# Patient Record
Sex: Male | Born: 1980 | Hispanic: Yes | Marital: Single | Smoking: Never smoker
Health system: Southern US, Community
[De-identification: ages and names within clinical notes are randomized; demographics above are authoritative.]

---

## 2020-04-26 ENCOUNTER — Encounter: Payer: Self-pay | Admitting: Emergency Medicine

## 2020-04-26 ENCOUNTER — Other Ambulatory Visit: Payer: Self-pay

## 2020-04-26 ENCOUNTER — Emergency Department: Payer: BC Managed Care – PPO

## 2020-04-26 ENCOUNTER — Emergency Department
Admission: EM | Admit: 2020-04-26 | Discharge: 2020-04-26 | Disposition: A | Discharge status: Home / Self Care | Payer: BC Managed Care – PPO | Attending: Emergency Medicine | Admitting: Emergency Medicine

## 2020-04-26 DIAGNOSIS — R0789 Other chest pain: Secondary | ICD-10-CM

## 2020-04-26 LAB — BASIC METABOLIC PANEL
Anion gap: 10 (ref 5–15)
BUN: 22 mg/dL — ABNORMAL HIGH (ref 6–20)
CO2: 26 mmol/L (ref 22–32)
Calcium: 9.5 mg/dL (ref 8.9–10.3)
Chloride: 102 mmol/L (ref 98–111)
Creatinine, Ser: 1.14 mg/dL (ref 0.61–1.24)
GFR calc Af Amer: >60 mL/min (ref >60)
GFR calc non Af Amer: >60 mL/min (ref >60)
Glucose, Bld: 153 mg/dL — ABNORMAL HIGH (ref 70–99)
Potassium: 3.9 mmol/L (ref 3.5–5.1)
Sodium: 138 mmol/L (ref 135–145)

## 2020-04-26 LAB — CBC
HCT: 42.6 % (ref 39.0–52.0)
Hemoglobin: 15 g/dL (ref 13.0–17.0)
MCH: 33.2 pg (ref 26.0–34.0)
MCHC: 35.2 g/dL (ref 30.0–36.0)
MCV: 94.2 fL (ref 80.0–100.0)
Platelets: 253 10*3/uL (ref 150–400)
RBC: 4.52 MIL/uL (ref 4.22–5.81)
RDW: 11.9 % (ref 11.5–15.5)
WBC: 20.1 10*3/uL — ABNORMAL HIGH (ref 4.0–10.5)
nRBC: 0 % (ref 0.0–0.2)

## 2020-04-26 LAB — TROPONIN I (HIGH SENSITIVITY)
Troponin I (High Sensitivity): 8 ng/L (ref <18)
Troponin I (High Sensitivity): 9 ng/L (ref <18)

## 2020-04-26 MED ORDER — IBUPROFEN 800 MG PO TABS: 800.0000 mg | ORAL_TABLET | Freq: Three times a day (TID) | ORAL | 0 refills | Status: AC | PRN

## 2020-04-26 MED ORDER — KETOROLAC TROMETHAMINE 30 MG/ML IJ SOLN
30.0000 mg | Freq: Once | INTRAMUSCULAR | Status: AC
  Administered 2020-04-26: 30 mg via INTRAVENOUS
  Filled 2020-04-26: qty 1

## 2020-04-26 MED ORDER — IOHEXOL 300 MG/ML  SOLN
75.0000 mL | Freq: Once | INTRAMUSCULAR | Status: AC | PRN
  Administered 2020-04-26: 75 mL via INTRAVENOUS
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
## 2020-04-26 NOTE — ED Notes (Signed)
Pt to ct scan.

## 2021-05-30 IMAGING — CR DG CHEST 2V
1 series · 2 of 2 positions shown · non-contrast
Comparison: None.

CLINICAL DATA: Chest pain

EXAM:
CHEST - 2 VIEW

[Series 1: w chest pa · 0.14mm/px · 2 of 2 slices shown]
[im 1/2]
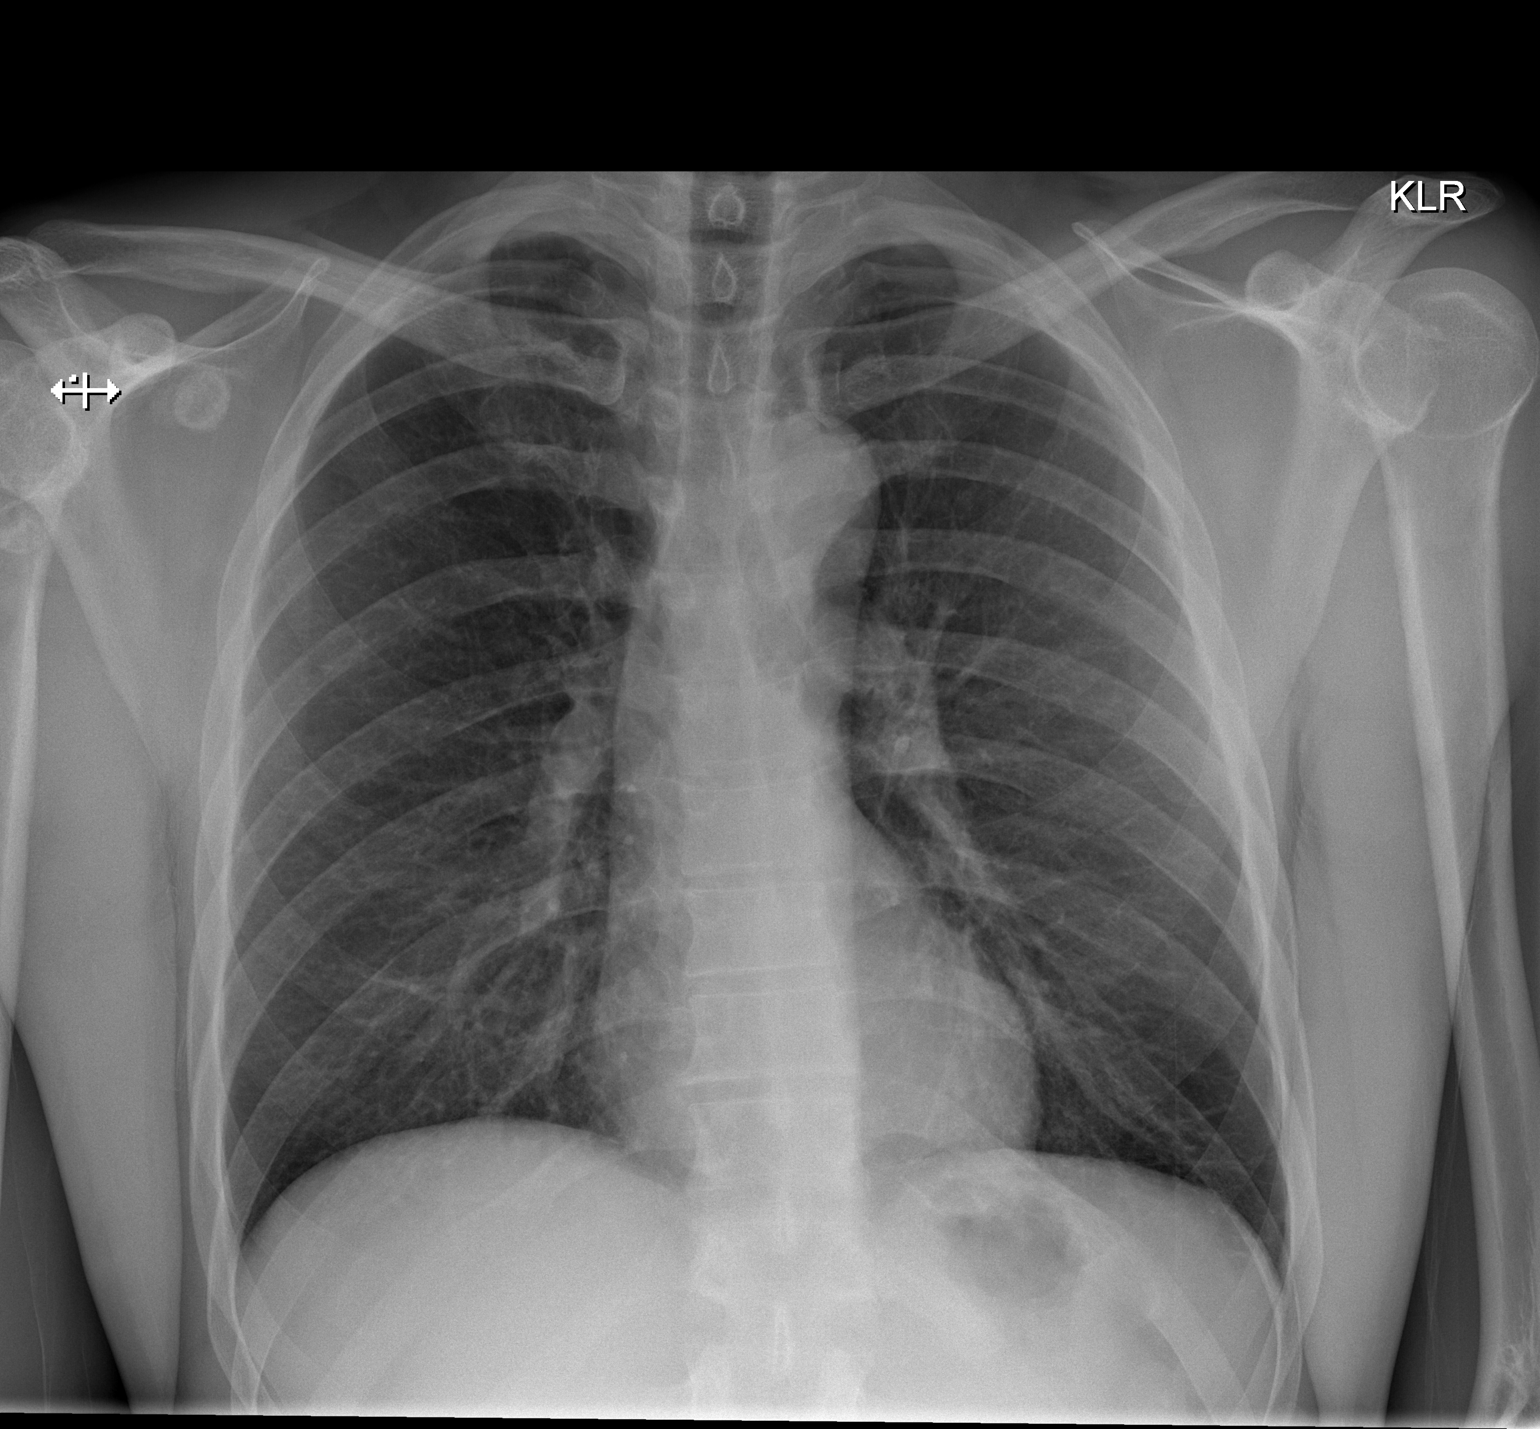
[im 2/2]
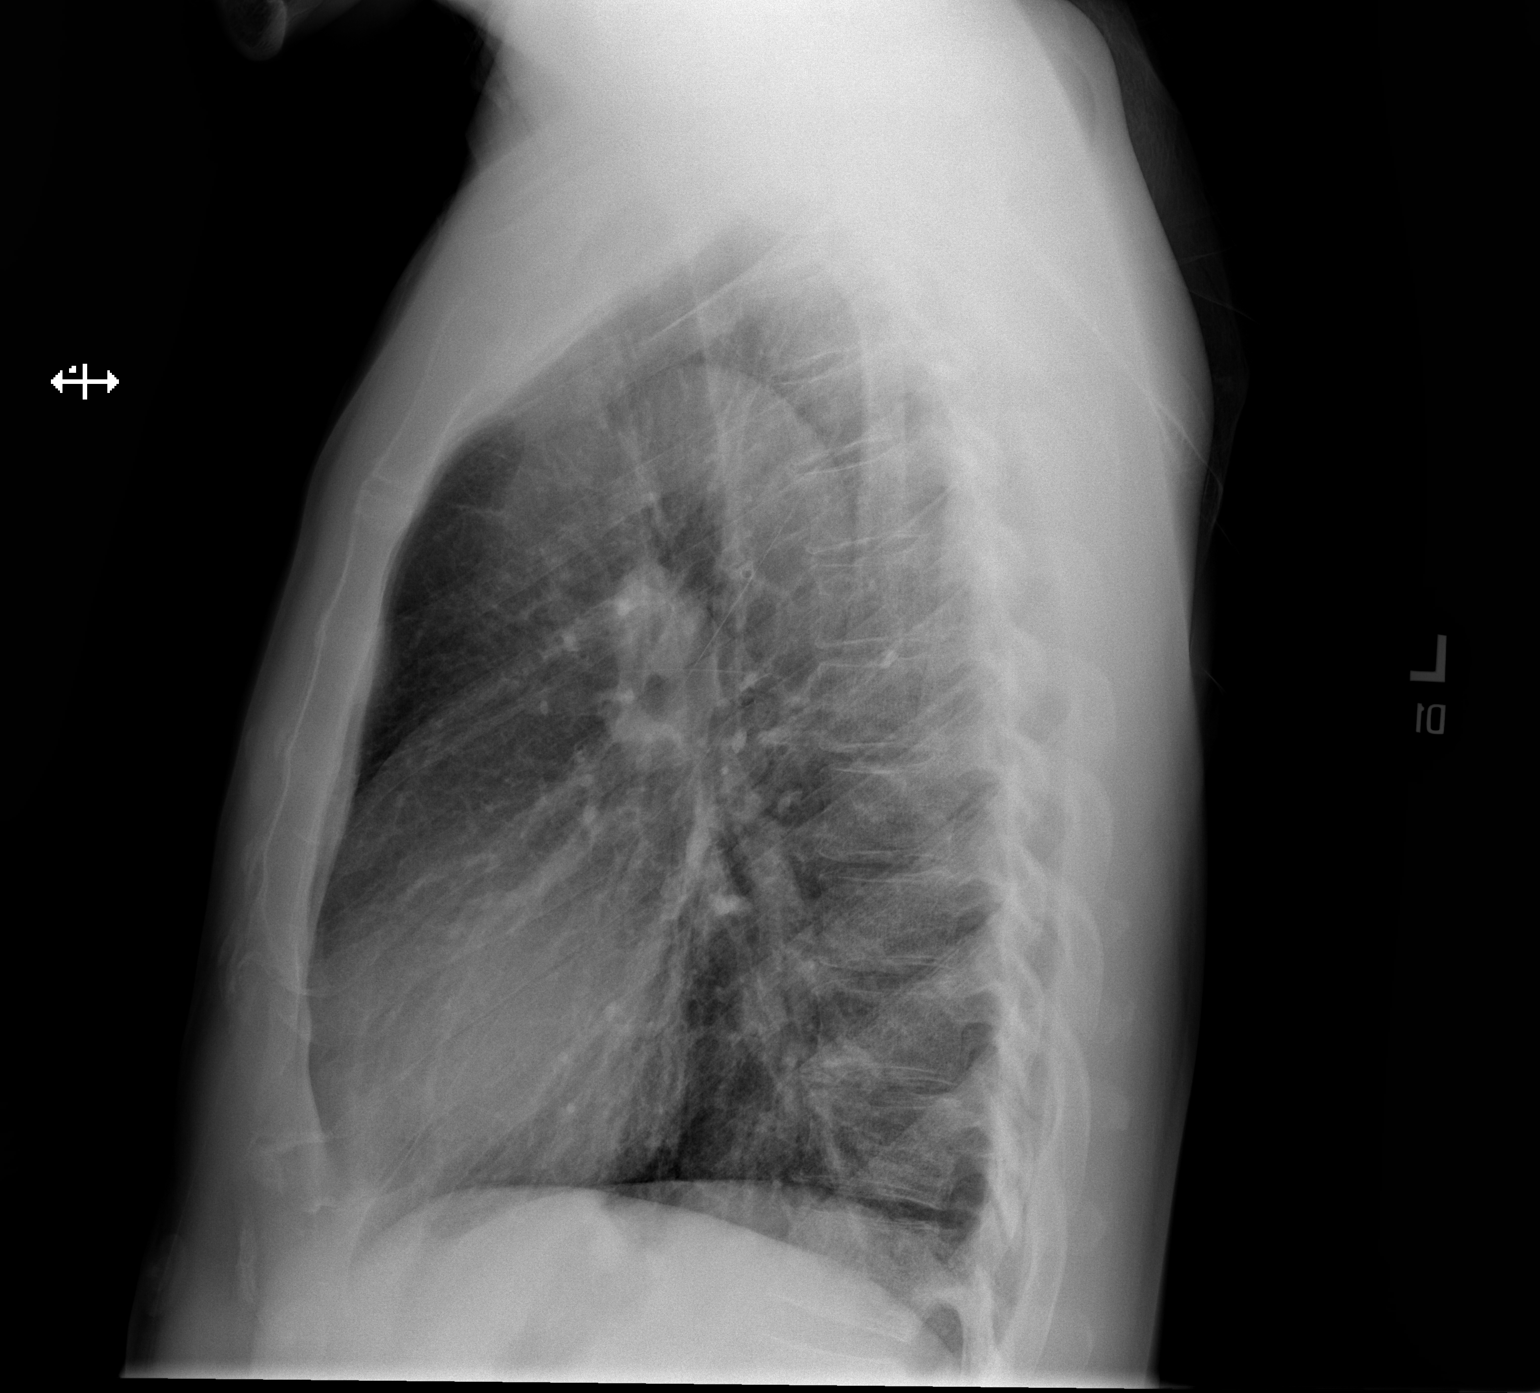

[2 of 2 positions shown; findings below may reference images not displayed]

FINDINGS: The heart size and mediastinal contours are within normal limits.
Both lungs are clear. The visualized skeletal structures are
unremarkable.
IMPRESSION: No active cardiopulmonary disease.
